# Patient Record
Sex: Male | Born: 1995 | Race: White | Hispanic: No | Marital: Single | Smoking: Never smoker
Health system: Southern US, Community
[De-identification: ages and names within clinical notes are randomized; demographics above are authoritative.]

## PROBLEM LIST (undated history)

## (undated) HISTORY — PX: MOUTH SURGERY: SHX715

## (undated) HISTORY — PX: FRACTURE SURGERY: SHX138

---
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
## 2019-07-28 ENCOUNTER — Other Ambulatory Visit: Payer: Self-pay

## 2019-07-28 DIAGNOSIS — Z20822 Contact with and (suspected) exposure to covid-19: Secondary | ICD-10-CM

## 2019-07-29 LAB — NOVEL CORONAVIRUS, NAA: SARS-CoV-2, NAA: NOT DETECTED
